# Patient Record
Sex: Female | Born: 1970 | Race: White | Hispanic: No | Marital: Married | Smoking: Never smoker
Health system: Southern US, Community
[De-identification: ages and names within clinical notes are randomized; demographics above are authoritative.]

## PROBLEM LIST (undated history)

## (undated) DIAGNOSIS — J45909 Unspecified asthma, uncomplicated: Secondary | ICD-10-CM

---

## 2009-01-30 ENCOUNTER — Ambulatory Visit: Payer: Self-pay | Admitting: Internal Medicine

## 2012-07-22 ENCOUNTER — Ambulatory Visit: Payer: Self-pay | Admitting: Family Medicine

## 2012-07-27 ENCOUNTER — Ambulatory Visit: Payer: Self-pay | Admitting: Family Medicine

## 2013-06-09 IMAGING — MG MM ADDITIONAL VIEWS AT NO CHARGE
1 series · 4 of 4 positions shown · non-contrast
Comparison: none

REASON FOR EXAM: av rt asymmetric density
COMMENTS:

PROCEDURE:     MAM - MAM DIG ADDVIEWS RT SCR  - July 27, 2012  [DATE]
RESULT:

[R ML · right · 4 of 4 slices shown]
[im 1/4]
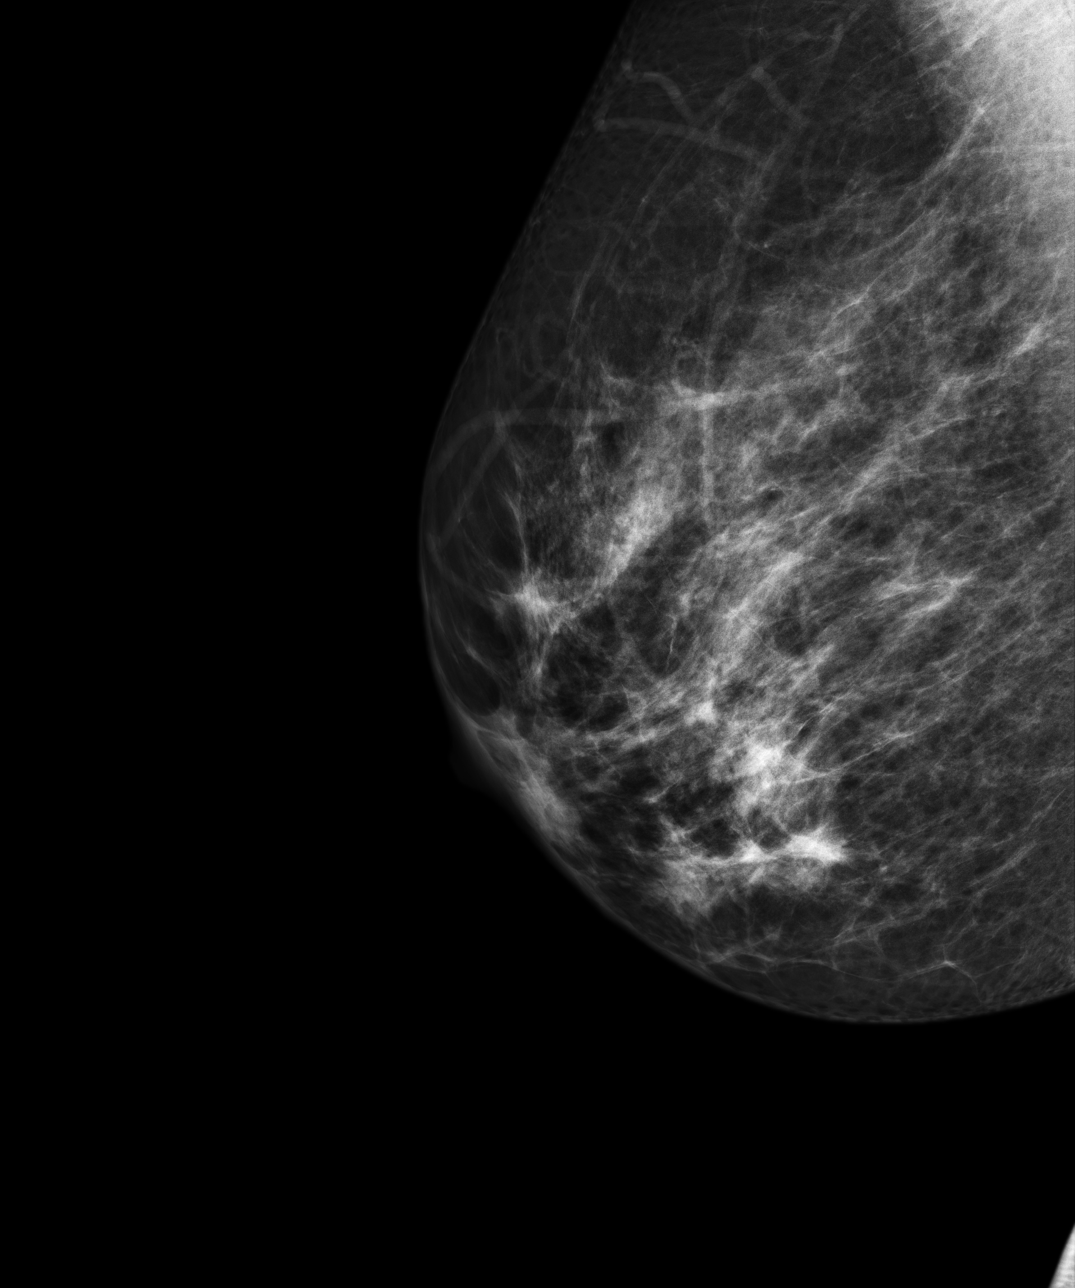
[im 2/4]
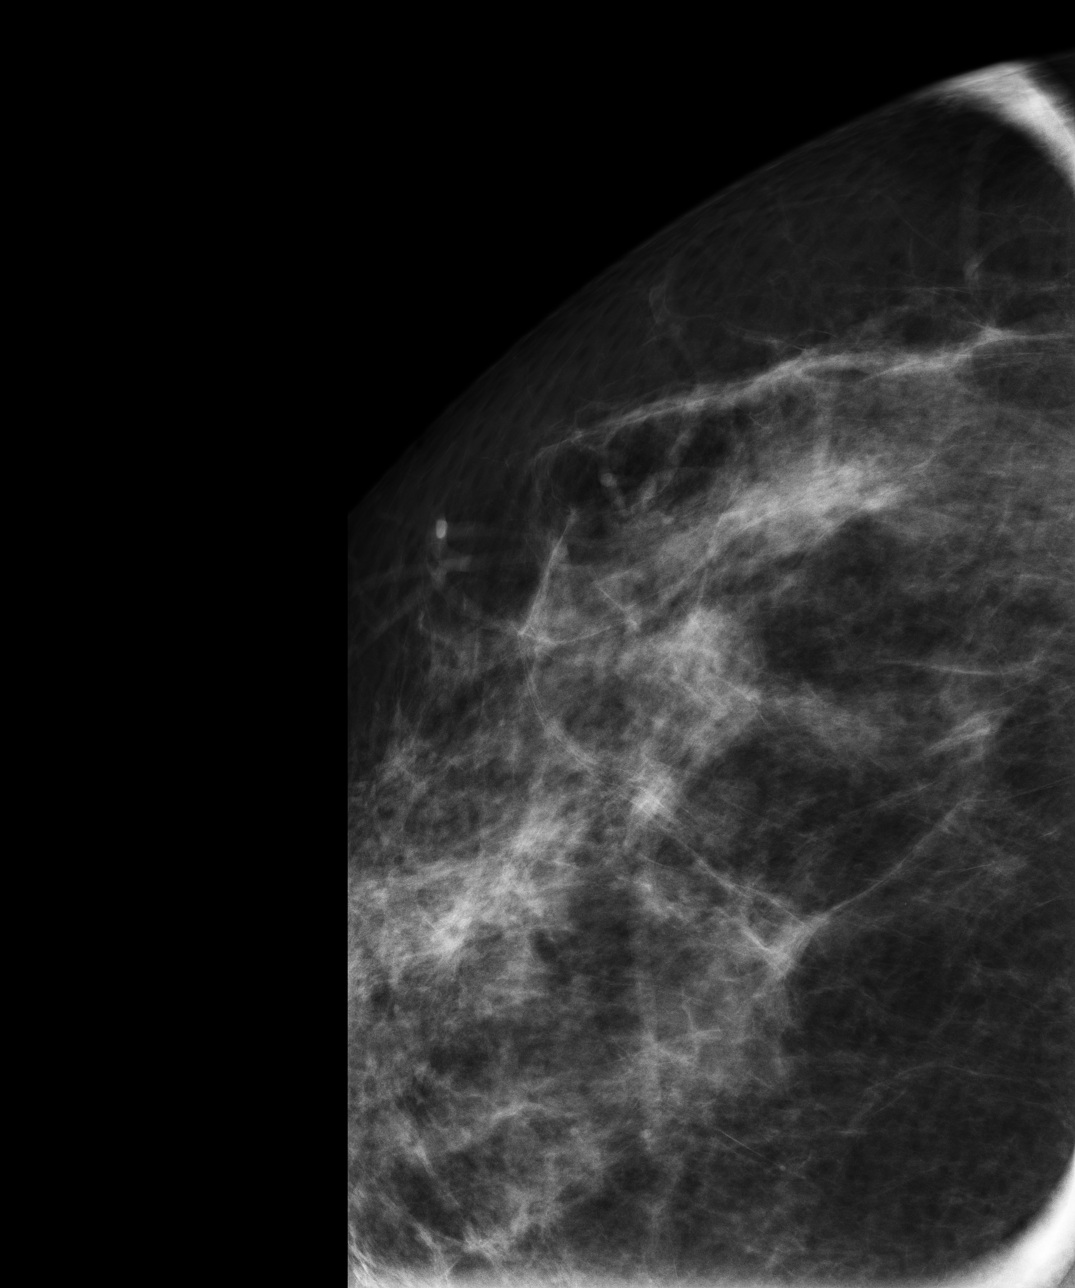
[im 3/4]
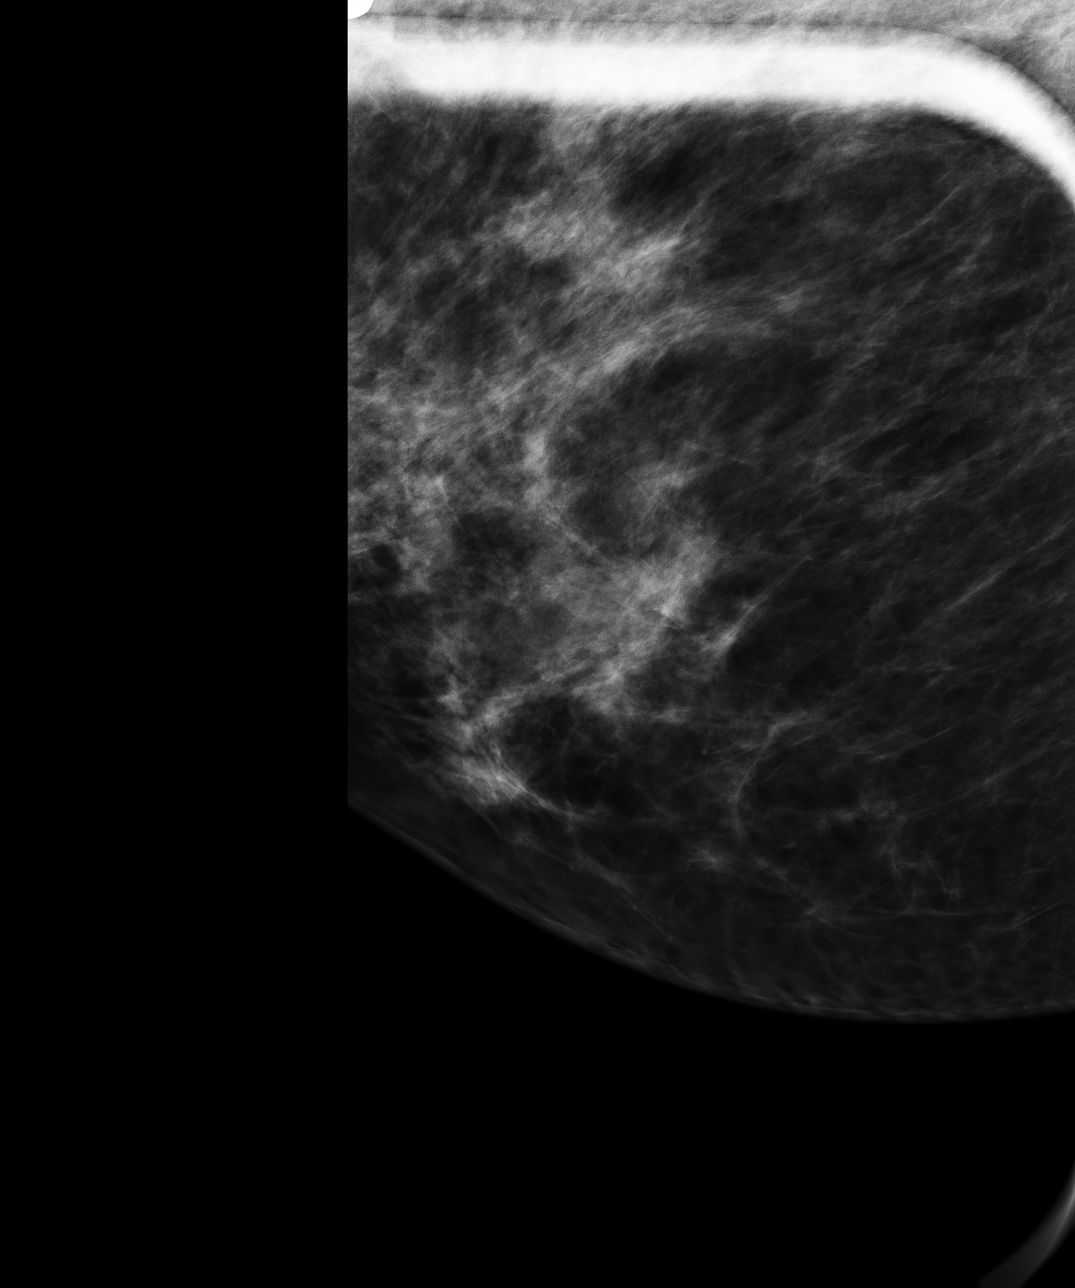
[im 4/4]
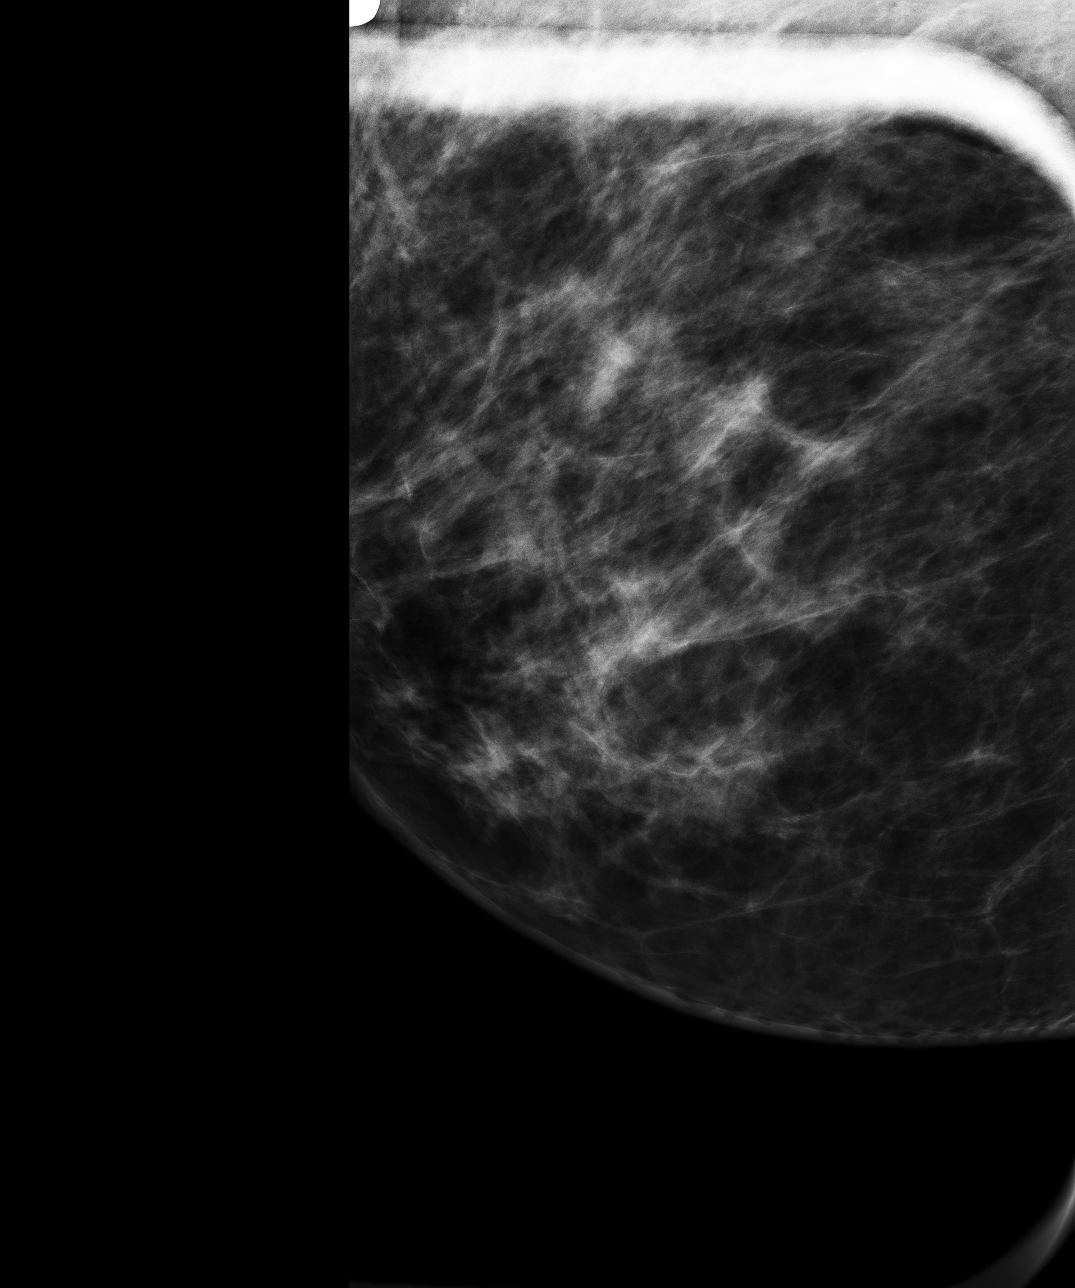

[4 of 4 positions shown; findings below may reference images not displayed]

FINDINGS: The area of asymmetric density within the right breast effaces
with compression.
IMPRESSION: BI-RADS: Category 2 - Benign Finding.

A NEGATIVE MAMMOGRAM REPORT DOES NOT PRECLUDE BIOPSY OR OTHER EVALUATION OF
A CLINICALLY PALPABLE OR OTHERWISE SUSPICIOUS MASS OR LESION. BREAST CANCER
MAY NOT BE DETECTED BY MAMMOGRAPHY IN UP TO 10% OF CASES.

## 2017-10-15 ENCOUNTER — Other Ambulatory Visit: Payer: Self-pay

## 2017-10-15 ENCOUNTER — Ambulatory Visit
Admission: EM | Admit: 2017-10-15 | Discharge: 2017-10-15 | Disposition: A | Discharge status: Home / Self Care | Payer: BLUE CROSS/BLUE SHIELD

## 2017-10-15 ENCOUNTER — Encounter: Payer: Self-pay | Admitting: Emergency Medicine

## 2017-10-15 DIAGNOSIS — J45901 Unspecified asthma with (acute) exacerbation: Secondary | ICD-10-CM | POA: Diagnosis not present

## 2017-10-15 DIAGNOSIS — J069 Acute upper respiratory infection, unspecified: Secondary | ICD-10-CM

## 2017-10-15 DIAGNOSIS — J40 Bronchitis, not specified as acute or chronic: Secondary | ICD-10-CM

## 2017-10-15 HISTORY — DX: Unspecified asthma, uncomplicated: J45.909

## 2017-10-15 MED ORDER — BENZONATATE 100 MG PO CAPS: 100.0000 mg | ORAL_CAPSULE | Freq: Three times a day (TID) | ORAL | 0 refills | Status: AC | PRN

## 2017-10-15 MED ORDER — IPRATROPIUM-ALBUTEROL 0.5-2.5 (3) MG/3ML IN SOLN
3.0000 mL | Freq: Once | RESPIRATORY_TRACT | Status: AC
  Administered 2017-10-15: 3 mL via RESPIRATORY_TRACT

## 2017-10-15 MED ORDER — HYDROCOD POLST-CPM POLST ER 10-8 MG/5ML PO SUER: 5.0000 mL | Freq: Every evening | ORAL | 0 refills | Status: AC | PRN

## 2017-10-15 MED ORDER — DOXYCYCLINE HYCLATE 100 MG PO CAPS: 100.0000 mg | ORAL_CAPSULE | Freq: Two times a day (BID) | ORAL | 0 refills | Status: AC

## 2017-10-15 MED ORDER — PREDNISONE 10 MG PO TABS: ORAL_TABLET | ORAL | 0 refills | Status: AC

## 2017-10-15 NOTE — Discharge Instructions (Signed)
Take medication as prescribed. Rest. Drink plenty of fluids.  ° °Follow up with your primary care physician this week as needed. Return to Urgent care for new or worsening concerns.  ° °

## 2017-10-15 NOTE — ED Provider Notes (Signed)
[facility] ____________________________________________  Time seen: Approximately 11:47 AM  I have reviewed the triage vital signs and the nursing notes.   HISTORY  Chief Complaint Shortness of Breath and Cough   HPI Paula Banks is a 47 y.o. female presenting for approximately 1 week of runny nose, cough, nasal congestion with increased cough with intermittent wheezing over the last 4 days.  Reports history of asthma that intermittently flares up after sickness.  States over the last several days she has had tightness in her chest with associated shortness of breath with wheezing only.  Denies any other shortness of breath.  No chest pain.  Denies hemoptysis.  States thick yellow drainage from blowing nose as well as cough.  Has continue to remain active.  States unresolved with over-the-counter cough and congestion medication.  Has been intermittently using her ProAir or albuterol inhaler with some improvement, no resolution.  States cough is disrupting sleep as well as daytime activities.  Denies known sick contacts.  Denies other aggravating or alleviating factors.  No accompanying fevers.  Reports otherwise feels well. Denies chest pain, abdominal pain, dysuria, extremity pain, extremity swelling or rash. Denies recent sickness. Denies recent antibiotic use.  Denies recent immobilization.  [place]: PCP   Past Medical History:  Diagnosis Date  . Asthma     There are no active problems to display for this patient.   History reviewed. No pertinent surgical history.   No current facility-administered medications for this encounter.   Current Outpatient Medications:  .  albuterol (PROVENTIL HFA;VENTOLIN HFA) 108 (90 Base) MCG/ACT inhaler, Inhale 1-2 puffs into the lungs every 6 (six) hours as needed for wheezing or shortness of breath., Disp: , Rfl:  .  benzonatate (TESSALON PERLES) 100 MG capsule, Take 1 capsule (100 mg total) by mouth 3 (three)  times daily as needed for cough., Disp: 15 capsule, Rfl: 0 .  chlorpheniramine-HYDROcodone (TUSSIONEX PENNKINETIC ER) 10-8 MG/5ML SUER, Take 5 mLs by mouth at bedtime as needed for cough. do not drive or operate machinery while taking as can cause drowsiness., Disp: 75 mL, Rfl: 0 .  doxycycline (VIBRAMYCIN) 100 MG capsule, Take 1 capsule (100 mg total) by mouth 2 (two) times daily., Disp: 20 capsule, Rfl: 0 .  predniSONE (DELTASONE) 10 MG tablet, Start 60 mg po day one, then 50 mg po day two, taper by 10 mg daily until complete., Disp: 21 tablet, Rfl: 0  Allergies Patient has no known allergies.  History reviewed. No pertinent family history.  Social History Social History   Tobacco Use  . Smoking status: Never Smoker  . Smokeless tobacco: Never Used  Substance Use Topics  . Alcohol use: No    Frequency: Never  . Drug use: Not on file    Review of Systems Constitutional: No fever/chills ENT: No sore throat. Cardiovascular: Denies chest pain. Respiratory:As above.  Gastrointestinal: No abdominal pain.   Musculoskeletal: Negative for back pain. Skin: Negative for rash.  ____________________________________________   PHYSICAL EXAM:  VITAL SIGNS: ED Triage Vitals  Enc Vitals Group     BP 10/15/17 1129 127/62     Pulse Rate 10/15/17 1129 86     Resp 10/15/17 1129 16     Temp 10/15/17 1129 98.4 F (36.9 C)     Temp Source 10/15/17 1129 Oral     SpO2 10/15/17 1129 97 %     Weight 10/15/17 1125 155 lb (70.3 kg)     Height 10/15/17 1125 5\' 5"  (  1.651 m)     Head Circumference --      Peak Flow --      Pain Score 10/15/17 1125 3     Pain Loc --      Pain Edu? --      Excl. in GC? --     Constitutional: Alert and oriented. Well appearing and in no acute distress. Eyes: Conjunctivae are normal. Head: Atraumatic. No sinus tenderness to palpation. No swelling. No erythema.  Ears: no erythema, normal TMs bilaterally.   Nose:Nasal congestion with clear  rhinorrhea  Mouth/Throat: Mucous membranes are moist. No pharyngeal erythema. No tonsillar swelling or exudate.  Neck: No stridor.  No cervical spine tenderness to palpation. Hematological/Lymphatic/Immunilogical: No cervical lymphadenopathy. Cardiovascular: Normal rate, regular rhythm. Grossly normal heart sounds.  Good peripheral circulation. Respiratory: Normal respiratory effort.  No retractions.  Mild scattered wheezes.  Mild scattered rhonchi.  Intermittent cough noted room with bronchospasm. Musculoskeletal: Ambulatory with steady gait. No cervical, thoracic or lumbar tenderness to palpation.  No lower extremity tenderness or edema noted. Neurologic:  Normal speech and language. No gait instability. Skin:  Skin appears warm, dry and intact. No rash noted. Psychiatric: Mood and affect are normal. Speech and behavior are normal.  ___________________________________________   LABS (all labs ordered are listed, but only abnormal results are displayed)  Labs Reviewed - No data to display  PROCEDURES Procedures   ________________________________________   INITIAL IMPRESSION / ASSESSMENT AND PLAN / ED COURSE  Pertinent labs & imaging results that were available during my care of the patient were reviewed by me and considered in my medical decision making (see chart for details).  Overall well-appearing patient.  No acute distress.  Suspect recent viral  upper respiratory infection, suspect asthmatic bronchitis, concern for secondary infection.  Discussed chest x-ray with patient, will defer at this time, patient agrees.  Will empirically treat patient with oral doxycycline, prednisone taper, appearing Tessalon Perles and as needed Tussionex.  Continue home albuterol inhaler.  2 duo nebs given in urgent care, post  breathing treatments in urgent care, wheezes fully resolved and patient reports feeling much better.  Discussed indication, risks and benefits of medications with  patient.  Discussed follow up with Primary care physician this week. Discussed follow up and return parameters including no resolution or any worsening concerns. Patient verbalized understanding and agreed to plan.   ____________________________________________   FINAL CLINICAL IMPRESSION(S) / ED DIAGNOSES  Final diagnoses:  Asthmatic bronchitis with acute exacerbation, unspecified asthma severity, unspecified whether persistent  Upper respiratory tract infection, unspecified type     ED Discharge Orders        Ordered    predniSONE (DELTASONE) 10 MG tablet     10/15/17 1152    chlorpheniramine-HYDROcodone (TUSSIONEX PENNKINETIC ER) 10-8 MG/5ML SUER  At bedtime PRN     10/15/17 1152    benzonatate (TESSALON PERLES) 100 MG capsule  3 times daily PRN     10/15/17 1152    doxycycline (VIBRAMYCIN) 100 MG capsule  2 times daily     10/15/17 1152       Note: This dictation was prepared with Dragon dictation along with smaller phrase technology. Any transcriptional errors that result from this process are unintentional.         Renford Dills, NP 10/15/17 1218

## 2017-10-15 NOTE — ED Triage Notes (Signed)
Patient c/o cough and SOB that has gotten worse over the past 4 days.
